# Patient Record
Sex: Male | Born: 1940 | Race: White | Hispanic: No | Smoking: Former smoker
Health system: Southern US, Community
[De-identification: ages and names within clinical notes are randomized; demographics above are authoritative.]

## PROBLEM LIST (undated history)

## (undated) DIAGNOSIS — I2510 Atherosclerotic heart disease of native coronary artery without angina pectoris: Secondary | ICD-10-CM

## (undated) DIAGNOSIS — G8929 Other chronic pain: Secondary | ICD-10-CM

## (undated) DIAGNOSIS — M25569 Pain in unspecified knee: Secondary | ICD-10-CM

## (undated) DIAGNOSIS — I219 Acute myocardial infarction, unspecified: Secondary | ICD-10-CM

## (undated) DIAGNOSIS — I10 Essential (primary) hypertension: Secondary | ICD-10-CM

## (undated) DIAGNOSIS — M1990 Unspecified osteoarthritis, unspecified site: Secondary | ICD-10-CM

## (undated) DIAGNOSIS — E785 Hyperlipidemia, unspecified: Secondary | ICD-10-CM

## (undated) HISTORY — DX: Atherosclerotic heart disease of native coronary artery without angina pectoris: I25.10

## (undated) HISTORY — DX: Acute myocardial infarction, unspecified: I21.9

## (undated) HISTORY — DX: Other chronic pain: G89.29

## (undated) HISTORY — DX: Pain in unspecified knee: M25.569

## (undated) HISTORY — DX: Essential (primary) hypertension: I10

## (undated) HISTORY — DX: Hyperlipidemia, unspecified: E78.5

## (undated) HISTORY — DX: Unspecified osteoarthritis, unspecified site: M19.90

---
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
## 2008-05-14 ENCOUNTER — Encounter: Payer: Self-pay | Admitting: Internal Medicine

## 2008-05-15 ENCOUNTER — Encounter: Payer: Self-pay | Admitting: Internal Medicine

## 2008-06-04 ENCOUNTER — Encounter: Payer: Self-pay | Admitting: Internal Medicine

## 2008-06-18 ENCOUNTER — Inpatient Hospital Stay: Payer: PRIVATE HEALTH INSURANCE | Admitting: Cardiology

## 2008-06-18 ENCOUNTER — Encounter: Payer: Self-pay | Admitting: Internal Medicine

## 2008-06-19 ENCOUNTER — Encounter: Payer: Self-pay | Admitting: Internal Medicine

## 2008-07-09 ENCOUNTER — Encounter: Payer: Self-pay | Admitting: Internal Medicine

## 2008-09-13 HISTORY — PX: CORONARY ARTERY BYPASS GRAFT: SHX141

## 2008-09-25 ENCOUNTER — Encounter: Payer: Self-pay | Admitting: Internal Medicine

## 2008-09-29 ENCOUNTER — Encounter: Payer: Self-pay | Admitting: Internal Medicine

## 2008-10-12 ENCOUNTER — Encounter: Payer: Self-pay | Admitting: Internal Medicine

## 2008-10-18 ENCOUNTER — Encounter: Payer: Self-pay | Admitting: Internal Medicine

## 2008-10-26 ENCOUNTER — Ambulatory Visit: Payer: Self-pay | Admitting: Internal Medicine

## 2008-10-26 DIAGNOSIS — E785 Hyperlipidemia, unspecified: Secondary | ICD-10-CM

## 2008-10-26 DIAGNOSIS — I25810 Atherosclerosis of coronary artery bypass graft(s) without angina pectoris: Secondary | ICD-10-CM | POA: Insufficient documentation

## 2008-10-26 DIAGNOSIS — M1710 Unilateral primary osteoarthritis, unspecified knee: Secondary | ICD-10-CM

## 2008-10-26 DIAGNOSIS — I10 Essential (primary) hypertension: Secondary | ICD-10-CM

## 2008-10-29 ENCOUNTER — Encounter: Payer: Self-pay | Admitting: Internal Medicine

## 2008-11-15 ENCOUNTER — Telehealth (INDEPENDENT_AMBULATORY_CARE_PROVIDER_SITE_OTHER): Payer: Self-pay | Admitting: *Deleted

## 2009-02-05 ENCOUNTER — Ambulatory Visit: Payer: Self-pay | Admitting: Internal Medicine

## 2009-03-20 ENCOUNTER — Telehealth: Payer: Self-pay | Admitting: Internal Medicine

## 2009-04-01 ENCOUNTER — Encounter: Payer: Self-pay | Admitting: Internal Medicine

## 2009-04-22 ENCOUNTER — Encounter (HOSPITAL_COMMUNITY): Admission: RE | Admit: 2009-04-22 | Discharge: 2009-05-22 | Payer: Self-pay | Admitting: Internal Medicine

## 2009-04-30 ENCOUNTER — Ambulatory Visit: Payer: Self-pay | Admitting: Cardiovascular Disease

## 2009-04-30 ENCOUNTER — Encounter: Payer: Self-pay | Admitting: Internal Medicine

## 2009-05-03 LAB — CONVERTED CEMR LAB
AST: 50 units/L — ABNORMAL HIGH (ref 0–37)
Alkaline Phosphatase: 84 units/L (ref 39–117)
BUN: 17 mg/dL (ref 6–23)
Calcium: 9.4 mg/dL (ref 8.4–10.5)
Chloride: 100 meq/L (ref 96–112)
Creatinine, Ser: 1.09 mg/dL (ref 0.40–1.50)
HDL: 56 mg/dL (ref >39)
Total Bilirubin: 0.4 mg/dL (ref 0.3–1.2)
Total CHOL/HDL Ratio: 3.6

## 2009-10-28 ENCOUNTER — Ambulatory Visit: Payer: Self-pay | Admitting: Internal Medicine

## 2009-12-11 ENCOUNTER — Telehealth (INDEPENDENT_AMBULATORY_CARE_PROVIDER_SITE_OTHER): Payer: Self-pay | Admitting: *Deleted

## 2009-12-11 ENCOUNTER — Telehealth: Payer: Self-pay | Admitting: Internal Medicine

## 2010-01-07 ENCOUNTER — Encounter: Payer: Self-pay | Admitting: Internal Medicine

## 2010-01-10 LAB — CONVERTED CEMR LAB
ALT: 79 units/L — ABNORMAL HIGH (ref 0–53)
AST: 101 units/L — ABNORMAL HIGH (ref 0–37)
Bilirubin, Direct: 0.2 mg/dL (ref 0.0–0.3)
Calcium: 9 mg/dL (ref 8.4–10.5)
Cholesterol: 169 mg/dL (ref 0–200)
Glucose, Bld: 102 mg/dL — ABNORMAL HIGH (ref 70–99)
Indirect Bilirubin: 0.4 mg/dL (ref 0.0–0.9)
Sodium: 138 meq/L (ref 135–145)
Total CHOL/HDL Ratio: 2.8

## 2010-03-06 ENCOUNTER — Ambulatory Visit: Payer: Self-pay | Admitting: Internal Medicine

## 2010-03-06 ENCOUNTER — Encounter: Payer: Self-pay | Admitting: Internal Medicine

## 2010-03-07 ENCOUNTER — Encounter: Payer: Self-pay | Admitting: Internal Medicine

## 2010-04-13 LAB — CONVERTED CEMR LAB
ALT: 56 units/L
BUN: 14 mg/dL (ref 6–23)
CO2: 25 meq/L (ref 19–32)
Chloride: 104 meq/L
Glomerular Filtration Rate, Af Am: >60 mL/min/{1.73_m2}
Glucose, Bld: 87 mg/dL (ref 70–99)
HDL: 51.3 mg/dL
Hgb A1c MFr Bld: 5.5 %
LDL Cholesterol: 125.3 mg/dL
Potassium: 4.6 meq/L (ref 3.5–5.3)
Potassium: 5 meq/L
Sodium: 136 meq/L (ref 135–145)
Total Bilirubin: 0.8 mg/dL
Triglyceride fasting, serum: 192 mg/dL

## 2010-04-17 NOTE — Assessment & Plan Note (Signed)
Summary: per check out/sf   Visit Type:  Follow-up Referring Shafiq Larch:  Italy Hughes MD Primary Camisha Srey:  Conchita Paris MD  CC:  no complaints.  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   Experienced NSTEMI in 4/10 underwent cath at [facility] by Dr. Darrold Junker. EF 67% LAD 90% mid, 60% distal, Diag 50%. LCX 30% OM2 100% RCA 60%. Underwent BMS to LAD. Treated with Plavix x 3 weeks. Admitted to [facility] with [place] in 7/10 and underwent LIMA-LAD and SVG-DIAG by Dr. 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 Returns for f/u.   We saw him 3 in November 2010. LDL was elevated so switched to Crestor 40.   Here for routine f/u. Continues to have problems with severe R knee OA and going to see Dr. Merita Norton. Remains fairly active as limited by his knee pain. No CP or SOB. BP doing pretty well with systolics 130-135. No readings over 140 recently. Dr. Candelaria Stagers following lipids. Now on lipitor 80/zetia 10/fish oil.  Last lipids (10/11) TC 169 HDL 61  LDL 93   Current Medications (verified): 1)  Aspirin 81 Mg Tbec (Aspirin) .... Take 2 Tablet By Mouth Daily 2)  Lisinopril 10 Mg Tabs (Lisinopril) .... Take One Tablet By Mouth Two Times A Day 3)  Metoprolol Succinate 50 Mg Xr24h-Tab (Metoprolol Succinate) .... Take One Tablet By Mouth Daily 4)  Lipitor 80 Mg Tabs (Atorvastatin Calcium) .... Take One Tablet By Mouth Daily. 5)  Fish Oil 1000 Mg Caps (Omega-3 Fatty Acids) .... 2 Caps By Mouth Daily 6)  Zetia 10 Mg Tabs (Ezetimibe) .... Take One Tablet By Mouth Daily.  Allergies (verified): 1)  ! * Wasp and [employer]  Review of Systems       As per HPI and past medical history; otherwise all systems negative.   Vital Signs:  Patient profile:   70 year old male Height:      71 inches Weight:      245 pounds BMI:     34.29 Pulse rate:   61 / minute BP sitting:   138 / 78  (left arm) Cuff size:   regular  Vitals Entered By:  Hardin Negus, RMA (March 06, 2010 11:55 AM)  Physical Exam  General:  Well appearing. no resp difficulty HEENT: normal Neck: supple. no JVD. Carotids 2+ bilat; no bruits. No lymphadenopathy or thryomegaly appreciated. Cor: PMI nondisplaced. Regular rate & rhythm. No rubs, gallops, murmur. Sternum looks good Lungs: clear Abdomen: obese soft, nontender, nondistended.Good bowel sounds. Extremities: no cyanosis, clubbing, rash, edema Neuro: alert & orientedx3, cranial nerves grossly intact. moves all 4 extremities w/o difficulty. 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 No evidence of ischemia. Continue current regimen.  Problem # 2:  HYPERTENSION, BENIGN (ICD-401.1) BP fairly well controlled. If systolics creep over 140 would increase lisinopril to 20 two times a day.  Problem # 3:  HYPERLIPIDEMIA-MIXED (ICD-272.4) Much improved. Getting LDL close to goal. Continue current regimen. F/u with Dr. Candelaria Stagers  Problem # 4:  DEGENERATIVE JOINT DISEASE, RIGHT KNEE [phone]) Given recent CABG surgery and relatively preserved functional capcity without evidence of angina; I fell he is low risk for any peri-operative cardiac complications and can proceed with knee replacement if needed without any further cardiac work-up.   Other Orders: EKG w/ Interpretation (93000)  Patient Instructions: 1)  Your physician wants you  to follow-up in: 6 months.  You will receive a reminder letter in the mail two months in advance. If you don't receive a letter, please call our office to schedule the follow-up appointment. Prescriptions: ZETIA 10 MG TABS (EZETIMIBE) Take one tablet by mouth daily.  #30 x 12   Entered by:   Meredith Staggers, RN   Authorized by:   Dolores Patty, MD, [facility]   Signed by:   Meredith Staggers, RN on 03/06/2010   Method used:   [facility]  [address] [id number]       [address]       [place], [place]  [address]       Ph: [phone]       Fax: [phone]   RxID:   [id number]

## 2010-04-17 NOTE — Assessment & Plan Note (Signed)
Summary: rov per pt call/lg   Visit Type:  Follow-up Referring Aizlynn Digilio:  Italy Hughes MD Primary Jehad Bisono:  Conchita Paris MD  CC:  no cardiac compliants.  History of Present Illness: [name] is a 70 y/o male with h/o HTN, HL and CAD.   Experienced NSTEMI in 4/10 underwent cath at [facility] by Dr. Darrold Junker. EF 67% LAD 90% mid, 60% distal, Diag 50%. LCX 30% OM2 100% RCA 60%. Underwent BMS to LAD. Treated with Plavix x 3 weeks. Admitted to [facility] with [place] in 7/10 and underwent LIMA-LAD and SVG-DIAG by Dr. 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 Returns for f/u.   We saw him 3 in November 2010. LDL was elevated so switched to Crestor 40. Most recent lipids by Dr. Candelaria Stagers in June 2011 TC 215 (down from 249) TG 107 HDL 58 LDL 136 (down from 045). BP 148/91.   Taking BP at home. In am often 170/110 but after he takes meds. BP down to 115-120/70-80 around 10:30 or 11a. Feels good. No CP or SOB. Not exercising regularly although he tries to use exercise bike occasionally.   Current Medications (verified): 1)  Aspirin Ec 325 Mg Tbec (Aspirin) .... Take One Tablet By Mouth Daily 2)  Lisinopril 10 Mg Tabs (Lisinopril) .... Take One Tablet By Mouth Daily 3)  Centrum  Tabs (Multiple Vitamins-Minerals) .Marland Kitchen.. 1 By Mouth Once Daily 4)  Vitamin E 400 Unit Caps (Vitamin E) .Marland Kitchen.. 1 By Mouth Once Daily 5)  Metoprolol Succinate 50 Mg Xr24h-Tab (Metoprolol Succinate) .... Take One Tablet By Mouth Daily 6)  Crestor 40 Mg Tabs (Rosuvastatin Calcium) .... Take One Tablet By Mouth Daily. 7)  Fish Oil 1000 Mg Caps (Omega-3 Fatty Acids) .... 2 Caps By Mouth Daily 8)  Allergy Pill ([facility]) .... Once Daily  Allergies (verified): 1)  ! * Wasp and [employer]  Review of Systems       As per HPI and past medical history; otherwise all systems negative.   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 No evidence of ischemia. Continue current regimen.  Problem # 2:  HYPERTENSION, BENIGN (ICD-401.1) BP normal at lunch but high in am. Will increase lisinopril to 20 two times a day (take a dose at night) and follow BP closely.   Problem # 3:  HYPERLIPIDEMIA-MIXED (ICD-272.4) LDL goal < 100 (ideal < 70). Still not at goal despite high-dose statin. Add zetia 10mg .  Recheck in 3 months.  Other Orders: EKG w/ Interpretation (93000)  Patient Instructions: 1)  Increase Lisinopril to 10mg  two times a day  2)  Start Zetia 10mg  daily 3)  Have labwork in 2 months (we have given you a prescription to have this done) 4)  Follow up in 3 months. Prescriptions: ZETIA 10 MG TABS (EZETIMIBE) Take one tablet by mouth daily.  #30 x 0   Entered by:   Meredith Staggers, RN   Authorized by:   Dolores Patty, MD, [facility]   Signed by:   Meredith Staggers, RN on 10/28/2009   Method  used:   Print then Give to Patient   RxID:   [id number] LISINOPRIL 10 MG TABS (LISINOPRIL) Take one tablet by mouth two times a day  #180 x 3   Entered by:   Meredith Staggers, RN   Authorized by:   Dolores Patty, MD, [facility]   Signed by:   Meredith Staggers, RN on 10/28/2009   Method used:   [facility]        [facility]  [address] [id number]       [address]       [place], [place]  [address]       Ph: [phone]       Fax: [phone]   RxID:   [id number]

## 2010-04-17 NOTE — Progress Notes (Signed)
Summary: REHAB  Phone Note Call from Patient Call back at Home Phone [phone]   Caller: SELF Call For: [name] Summary of Call: PT WOULD LIKE TO START CARDIAC REHAB AND WOULD LIKE FOR IT TO BE AT [facility] Initial call taken by: Harlon Flor,  March 20, 2009 3:45 PM  Follow-up for Phone Call        order pending MD signature. Charlena Cross RN BSN

## 2010-04-17 NOTE — Letter (Signed)
Summary: [facility] Office Visit Note   [facility] Visit Note   Imported By: Roderic Ovens 04/10/2010 11:50:56  _____________________________________________________________________  External Attachment:    Type:   Image     Comment:   External Document

## 2010-04-17 NOTE — Miscellaneous (Signed)
Summary: Order  Order   Imported By: [place] Carbo 04/04/2009 11:14:24  _____________________________________________________________________  External Attachment:    Type:   Image     Comment:   External Document

## 2010-04-17 NOTE — Progress Notes (Signed)
Summary: pt needs medication changes  Phone Note Refill Request Call back at Home Phone [phone] Message from:  [facility] [address]  Refills Requested: Medication #1:  Simvastatin pt can no longer take crestor  Initial call taken by: Omer Jack,  December 11, 2009 12:42 PM    New/Updated Medications: LIPITOR 80 MG TABS (ATORVASTATIN CALCIUM) Take one tablet by mouth daily. Prescriptions: LIPITOR 80 MG TABS (ATORVASTATIN CALCIUM) Take one tablet by mouth daily.  #30 x 6   Entered by:   Hardin Negus, RMA   Authorized by:   Dolores Patty, MD, [facility]   Signed by:   Hardin Negus, RMA on 12/11/2009   Method used:   [facility]        [facility]  [address] [id number]       [address]       [place], [place]  [address]       Ph: [phone]       Fax: [phone]   RxID:   [id number]

## 2010-04-17 NOTE — Progress Notes (Signed)
  Walk in Patient Form Recieved " Pt would like to be taken off of Crestor and placed on something New" sent to [facility]  December 11, 2009 8:42 AM

## 2010-05-07 NOTE — Letter (Signed)
Summary: [facility]   [facility]   Imported By: Marylou Mccoy 04/28/2010 13:36:26  _____________________________________________________________________  External Attachment:    Type:   Image     Comment:   External Document

## 2010-05-16 ENCOUNTER — Other Ambulatory Visit (HOSPITAL_COMMUNITY): Payer: Medicare Other

## 2010-05-20 ENCOUNTER — Other Ambulatory Visit (HOSPITAL_COMMUNITY): Payer: Medicare Other

## 2010-05-26 ENCOUNTER — Ambulatory Visit (HOSPITAL_COMMUNITY): Admission: RE | Admit: 2010-05-26 | Payer: Medicare Other | Source: Ambulatory Visit | Admitting: Orthopedic Surgery

## 2010-08-12 ENCOUNTER — Other Ambulatory Visit: Payer: Self-pay | Admitting: Orthopedic Surgery

## 2010-08-12 ENCOUNTER — Encounter (HOSPITAL_COMMUNITY): Payer: Medicare Other

## 2010-08-12 ENCOUNTER — Ambulatory Visit (HOSPITAL_COMMUNITY)
Admission: RE | Admit: 2010-08-12 | Discharge: 2010-08-12 | Disposition: A | Discharge status: Home / Self Care | Payer: Medicare Other | Source: Ambulatory Visit | Attending: Orthopedic Surgery | Admitting: Orthopedic Surgery

## 2010-08-12 ENCOUNTER — Other Ambulatory Visit (HOSPITAL_COMMUNITY): Payer: Self-pay | Admitting: Orthopedic Surgery

## 2010-08-12 DIAGNOSIS — Z01811 Encounter for preprocedural respiratory examination: Secondary | ICD-10-CM | POA: Insufficient documentation

## 2010-08-12 DIAGNOSIS — Z87891 Personal history of nicotine dependence: Secondary | ICD-10-CM | POA: Insufficient documentation

## 2010-08-12 DIAGNOSIS — I10 Essential (primary) hypertension: Secondary | ICD-10-CM | POA: Insufficient documentation

## 2010-08-12 DIAGNOSIS — Z01818 Encounter for other preprocedural examination: Secondary | ICD-10-CM | POA: Insufficient documentation

## 2010-08-12 DIAGNOSIS — Z01812 Encounter for preprocedural laboratory examination: Secondary | ICD-10-CM | POA: Insufficient documentation

## 2010-08-12 DIAGNOSIS — Z951 Presence of aortocoronary bypass graft: Secondary | ICD-10-CM | POA: Insufficient documentation

## 2010-08-12 LAB — URINALYSIS, ROUTINE W REFLEX MICROSCOPIC
Hgb urine dipstick: NEGATIVE
Nitrite: NEGATIVE
Specific Gravity, Urine: 1.016 (ref 1.005–1.030)
Urobilinogen, UA: 0.2 mg/dL (ref 0.0–1.0)
pH: 6 (ref 5.0–8.0)

## 2010-08-12 LAB — COMPREHENSIVE METABOLIC PANEL
ALT: 56 U/L — ABNORMAL HIGH (ref 0–53)
AST: 58 U/L — ABNORMAL HIGH (ref 0–37)
Albumin: 3.4 g/dL — ABNORMAL LOW (ref 3.5–5.2)
Alkaline Phosphatase: 122 U/L — ABNORMAL HIGH (ref 39–117)
Chloride: 98 mEq/L (ref 96–112)
GFR calc Af Amer: >60 mL/min (ref >60)
Potassium: 4.3 mEq/L (ref 3.5–5.1)
Sodium: 133 mEq/L — ABNORMAL LOW (ref 135–145)
Total Bilirubin: 0.5 mg/dL (ref 0.3–1.2)

## 2010-08-12 LAB — CBC
Platelets: 236 10*3/uL (ref 150–400)
RBC: 4.45 MIL/uL (ref 4.22–5.81)
WBC: 7.1 10*3/uL (ref 4.0–10.5)

## 2010-08-12 LAB — APTT: aPTT: 35 seconds (ref 24–37)

## 2010-08-16 NOTE — H&P (Addendum)
[name], [name]                ACCOUNT NO.:  [id number]  MEDICAL RECORD NO.:  [id number]  LOCATION:                                 FACILITY:  PHYSICIAN:  Ollen Gross, M.D.    DATE OF BIRTH:  19-Jan-1941  DATE OF ADMISSION:  08/18/2010 DATE OF DISCHARGE:                             HISTORY & PHYSICAL   CHIEF COMPLAINT:  Right knee pain.  BRIEF HISTORY:  [name] is a 70 year old male who came in to see Dr. Lequita Halt as a second opinion back in December.  He has had problems with the right knee for several years over the past 5-6 years as this has become more and more problematic.  He is to a point now where he has pain at all times, it pops with rest as well as with activity.  It is limiting what he is able to do.  He has had injections in the past that did not provide long-term benefit.  He now presents for a right total knee arthroplasty.  PRIMARY CARE PHYSICIAN:  Claude Manges, MD, at [facility].  CARDIOLOGIST:  Bevelyn Buckles. Bensimhon, MD  MEDICATION ALLERGIES:  None.  CURRENT MEDICATIONS: 1. Zetia. 2. Metoprolol. 3. Lisinopril. 4. Benicar. 5. Fish oil. 6. Centrum. 7. Aspirin.  He will discontinue the fish oil, Centrum, and aspirin prior to surgery.  PAST MEDICAL HISTORY: 1. End-stage arthritis of both knees, worse on the right than the     left. 2. Tinnitus. 3. Dentures, partial on the bottom and full on top. 4. Hypertension. 5. History of myocardial infarction in July 2010. 6. Hyperlipidemia. 7. Heart stenting followed by bypass surgery. 8. History of gout.  PAST SURGICAL HISTORY:  He has had cardiac bypass in July 2010 and an appendectomy in 1959.  The patient states that he has done fine with anesthesia in the past.  FAMILY HISTORY:  Father passed of lung cancer at the age of 78.  He had a brother that passed of myocardial infarction in 2006.  He was 1 years old.  Mother is living, she is 21.  SOCIAL HISTORY:  The  patient is divorced.  He works as a Education officer, environmental.  He admits past use of tobacco products.  The last time he smoked cigarettes was 30 years ago.  He drinks about 4 glasses of alcohol daily.  He has 4 children.  He lives alone.  He does not have a caregiver lined up after surgery.  I suggest that we have a social work consult for consideration of skilled nursing facility.  REVIEW OF SYSTEMS:  GENERAL:  Negative for fever, chills, or weight change.  HEENT:  Positive for ringing in the ears and dentures. DERMATOLOGIC:  The patient does have a rash on his posterior aspect of the right ankle.  He is going to have this evaluated by his primary care physician on August 15, 2010.  RESPIRATORY:  Positive for shortness of breath on exertion.  Last chest x-ray was Aug 12, 2010.  CARDIOVASCULAR: Negative for chest pain or palpitations.  GI:  Negative for nausea, vomiting, diarrhea.  GU: Negative for hematuria, dysuria. MUSCULOSKELETAL: Positive for joint pain and  joint swelling.  PHYSICAL EXAMINATION:  VITAL SIGNS:  Pulse 70, respirations 18, blood pressure 118/60 in the left arm. GENERAL:  [name] is alert and oriented x3.  He is a pleasant 70 year old male well developed, well nourished, no apparent distress who is stated height of 5 feet 10 inches and a stated weight of 238 pounds. HEENT:  Normocephalic, atraumatic.  Extraocular movements intact.  He wears glasses and dentures. NECK:  Supple.  Full range of motion without lymphadenopathy. CHEST:  Lungs are clear to auscultation bilaterally without wheezes, rhonchi, or rales. HEART:  Regular rate and rhythm without murmur, S1 and S2 sounds are appreciated. ABDOMEN:  Bowel sounds present in all 4 quadrants.  Abdomen is obese, soft, nontender to palpation. EXTREMITIES:  Right knee exhibits a varus deformity.  Range of motion is 5-120 degrees.  He has pain with palpation over the medial joint line. SKIN:  As stated in the review of systems, the patient  has a red raised area on the right ankle that is excoriated. NEUROLOGIC:  Intact.  Peripheral vascular carotid pulses 2+ bilaterally without bruit.  Radiograph of both knees appeared to be bone-on-bone tricompartmental, worse on the right than the left.  IMPRESSION:  Bilateral end-stage arthritis of both knees, worse on the right than the left.  PLAN:  Right total knee arthroplasty to be performed by Dr. Lequita Halt.     Rozell Searing, PAC   ______________________________ Ollen Gross, M.D.    LD/MEDQ  D:  08/14/2010  T:  08/14/2010  Job:  [id number]  cc:   Claude Manges, MD Fax: [phone]  Electronically Signed by Rozell Searing  on 09/08/2010 09:02:57 AM Electronically Signed by Ollen Gross M.D. on 09/08/2010 05:01:20 PM

## 2010-08-18 ENCOUNTER — Inpatient Hospital Stay (HOSPITAL_COMMUNITY)
Admission: RE | Admit: 2010-08-18 | Discharge: 2010-08-21 | DRG: 470 | Disposition: A | Discharge status: Skilled Nursing Facility | Payer: Medicare Other | Source: Ambulatory Visit | Attending: Orthopedic Surgery | Admitting: Orthopedic Surgery

## 2010-08-18 DIAGNOSIS — M1710 Unilateral primary osteoarthritis, unspecified knee: Principal | ICD-10-CM | POA: Diagnosis present

## 2010-08-18 DIAGNOSIS — E871 Hypo-osmolality and hyponatremia: Secondary | ICD-10-CM | POA: Diagnosis not present

## 2010-08-18 DIAGNOSIS — I2510 Atherosclerotic heart disease of native coronary artery without angina pectoris: Secondary | ICD-10-CM | POA: Diagnosis present

## 2010-08-18 DIAGNOSIS — Z79899 Other long term (current) drug therapy: Secondary | ICD-10-CM

## 2010-08-18 DIAGNOSIS — E876 Hypokalemia: Secondary | ICD-10-CM | POA: Diagnosis not present

## 2010-08-18 DIAGNOSIS — I10 Essential (primary) hypertension: Secondary | ICD-10-CM | POA: Diagnosis present

## 2010-08-18 DIAGNOSIS — M109 Gout, unspecified: Secondary | ICD-10-CM | POA: Diagnosis present

## 2010-08-18 DIAGNOSIS — I252 Old myocardial infarction: Secondary | ICD-10-CM

## 2010-08-18 DIAGNOSIS — Z7982 Long term (current) use of aspirin: Secondary | ICD-10-CM

## 2010-08-18 DIAGNOSIS — Z951 Presence of aortocoronary bypass graft: Secondary | ICD-10-CM

## 2010-08-18 DIAGNOSIS — H9319 Tinnitus, unspecified ear: Secondary | ICD-10-CM | POA: Diagnosis present

## 2010-08-18 DIAGNOSIS — M21169 Varus deformity, not elsewhere classified, unspecified knee: Secondary | ICD-10-CM | POA: Diagnosis present

## 2010-08-18 DIAGNOSIS — E785 Hyperlipidemia, unspecified: Secondary | ICD-10-CM | POA: Diagnosis present

## 2010-08-18 HISTORY — PX: JOINT REPLACEMENT: SHX530

## 2010-08-18 LAB — TYPE AND SCREEN: Antibody Screen: NEGATIVE

## 2010-08-19 LAB — BASIC METABOLIC PANEL
BUN: 11 mg/dL (ref 6–23)
CO2: 22 mEq/L (ref 19–32)
Chloride: 98 mEq/L (ref 96–112)
Creatinine, Ser: 0.87 mg/dL (ref 0.4–1.5)
GFR calc Af Amer: >60 mL/min (ref >60)
Potassium: 3.8 mEq/L (ref 3.5–5.1)

## 2010-08-19 LAB — CBC
Hemoglobin: 11.5 g/dL — ABNORMAL LOW (ref 13.0–17.0)
MCH: 31.6 pg (ref 26.0–34.0)
MCV: 92 fL (ref 78.0–100.0)
Platelets: 210 10*3/uL (ref 150–400)
RBC: 3.64 MIL/uL — ABNORMAL LOW (ref 4.22–5.81)
WBC: 10.6 10*3/uL — ABNORMAL HIGH (ref 4.0–10.5)

## 2010-08-20 LAB — BASIC METABOLIC PANEL
BUN: 9 mg/dL (ref 6–23)
Chloride: 96 mEq/L (ref 96–112)
Creatinine, Ser: 0.82 mg/dL (ref 0.4–1.5)
Glucose, Bld: 127 mg/dL — ABNORMAL HIGH (ref 70–99)
Potassium: 3.4 mEq/L — ABNORMAL LOW (ref 3.5–5.1)

## 2010-08-20 LAB — CBC
HCT: 31.6 % — ABNORMAL LOW (ref 39.0–52.0)
MCH: 31.8 pg (ref 26.0–34.0)
MCV: 92.1 fL (ref 78.0–100.0)
RBC: 3.43 MIL/uL — ABNORMAL LOW (ref 4.22–5.81)
WBC: 11.7 10*3/uL — ABNORMAL HIGH (ref 4.0–10.5)

## 2010-08-20 NOTE — Op Note (Signed)
[name], [name]                ACCOUNT NO.:  [id number]  MEDICAL RECORD NO.:  [id number]  LOCATION:  [id number]                         FACILITY:  [facility]  PHYSICIAN:  Ollen Gross, M.D.    DATE OF BIRTH:  [birth year]  DATE OF PROCEDURE:  08/18/2010 DATE OF DISCHARGE:                              OPERATIVE REPORT   PREOPERATIVE DIAGNOSIS:  Osteoarthritis, right knee.  POSTOPERATIVE DIAGNOSIS:  Osteoarthritis, right knee.  PROCEDURE:  Right total knee arthroplasty.  SURGEON:  Ollen Gross, M.D.  ASSISTANT:  Alexzandrew L. Perkins, P.A.C.  ANESTHESIA:  Spinal.  ESTIMATED BLOOD LOSS:  Minimal.  DRAINS:  Hemovac x1.  TOURNIQUET TIME:  37 minutes at 300 mmHg.  COMPLICATIONS:  None.  CONDITION.:  Stable to recovery.  BRIEF CLINICAL NOTE:  [name] is a 70 year old male with advanced arthritis of the right knee with progressively worsening pain and dysfunction.  He has failed nonop management and presents for total knee arthroplasty.  PROCEDURE IN DETAIL:  After successful administration of spinal anesthetic, a tourniquet was placed high on his right thigh and his right lower extremity was prepped and draped in the usual sterile fashion.  Extremity was wrapped in Esmarch, knee flexed, tourniquet inflated to 300 mmHg.  Midline incision was made with a #10 blade through subcutaneous tissue to the level of the extensor mechanism.  A fresh blade was used make a medial parapatellar arthrotomy.  Soft tissue on the proximal medial tibia subperiosteally elevated to the joint line with the knife and into the semimembranosus bursa with a Cobb elevator. Soft tissue laterally was elevated with attention being paid to avoid patellar tendon on tibial tubercle.  The patella was everted, knee flexed 90 degrees and ACL and PCL removed.  Drill was used to create a starting hole in the distal femur and canal was thoroughly irrigated. The 5-degree right valgus alignment guide was placed.   Distal femoral cutting block is pinned to remove 10 mm off the distal femur.  Distal femoral resection was made with an oscillating saw.  The tibia subluxed forward and the menisci removed.  The extramedullary tibial alignment guide was placed referencing proximally at the medial aspect of the tibial tubercle and distally along the second metatarsal axis of the tibial crest.  The block was pinned to remove 2 mm off the more deficient medial side.  Tibial resection was made with an oscillating saw.  Size 4 was the most appropriate tibial component and the proximal tibia was prepared with a modular drill and keel punch for the size 4.  Femoral sizing guide was placed, size 5 was most appropriate.  Rotation was marked off the epicondylar axis and confirmed by creating a rectangular flexion gap at 90 degrees.  Size 5 cutting block was placed in this rotation and the anterior-posterior chamfer cuts made. Intercondylar block was placed and that cut was made.  Trial size 5 posterior stabilized femur was placed.  A 10-mm posterior stabilized rotating platform insert trial was placed.  Full extension was achieved with excellent varus-valgus and anterior-posterior balance throughout full range of motion.  The patella was everted and thickness measured to 21 mm.  Freehand resection was taken to  12 mm, 41 template was placed, lug holes were drilled, trial patella was placed and it tracked normally.  Osteophytes were removed off the posterior femur with the trial in place.  All trials were removed and the cut bone surfaces prepared with pulsatile lavage.  Cement was mixed and once ready for implantation, the size 4 mobile bearing tibial tray, size 5 posterior stabilized femur and 41 patella were cemented into place.  The patella was held with a clamp.  Trial 10-mm insert was placed, knee held in full extension and all extruded cement removed.  When the cement had fully hardened, then the permanent  10-mm posterior stabilized rotating platform insert was placed in the tibial tray.  The wound was copiously irrigated with saline solution and the arthrotomy closed over Hemovac drain with interrupted #1 PDS.  Flexion against gravity was 135 degrees and patella tracked normally.  Tourniquet was then released after total time of 37 minutes.  Subcu was then closed with interrupted 2-0 Vicryl, subcuticular running 4-0 Monocryl.  Catheter for Marcaine pain pump was placed and pump initiated.  Incisions were cleaned and dried and Steri- Strips and bulky sterile dressing applied.  He was then placed into a knee immobilizer, awakened and transported to recovery in stable condition.     Ollen Gross, M.D.     FA/MEDQ  D:  08/18/2010  T:  08/18/2010  Job:  [id number]  Electronically Signed by Ollen Gross M.D. on 08/20/2010 06:01:55 PM

## 2010-08-21 LAB — CBC
HCT: 30.8 % — ABNORMAL LOW (ref 39.0–52.0)
MCH: 31.3 pg (ref 26.0–34.0)
MCHC: 34.1 g/dL (ref 30.0–36.0)
MCV: 91.7 fL (ref 78.0–100.0)
Platelets: 215 10*3/uL (ref 150–400)
RDW: 14.3 % (ref 11.5–15.5)

## 2010-08-21 LAB — BASIC METABOLIC PANEL
BUN: 13 mg/dL (ref 6–23)
Calcium: 8.9 mg/dL (ref 8.4–10.5)
Creatinine, Ser: 0.81 mg/dL (ref 0.4–1.5)
GFR calc non Af Amer: >60 mL/min (ref >60)
Glucose, Bld: 134 mg/dL — ABNORMAL HIGH (ref 70–99)

## 2010-08-28 ENCOUNTER — Encounter: Payer: Self-pay | Admitting: Internal Medicine

## 2010-09-08 NOTE — Discharge Summary (Signed)
[name], [name]                ACCOUNT NO.:  [id number]  MEDICAL RECORD NO.:  [id number]  LOCATION:  1618                         FACILITY:  [facility]  PHYSICIAN:  Ollen Gross, M.D.    DATE OF BIRTH:  February 21, 1941  DATE OF ADMISSION:  08/18/2010 DATE OF DISCHARGE:                        DISCHARGE SUMMARY - REFERRING   TENTATIVE DATE OF DISCHARGE:  August 21, 2010.  ADMITTING DIAGNOSES: 1. Osteoarthritis, right knee greater than left knee. 2. Tinnitus. 3. Hypertension. 4. History of myocardial infarction, July 2010. 5. Hyperlipidemia. 6. Status post coronary artery bypass grafting surgery. 7. Status post cardiac catheterization with stenting. 8. History of gout.  DISCHARGE DIAGNOSES: 1. Osteoarthritis, right knee, status post right total knee     replacement arthroplasty. 2. Postoperative hyponatremia, improving. 3. Postoperative hypokalemia, improved. 4. Tinnitus. 5. Hypertension. 6. History of myocardial infarction, July 2010. 7. Hyperlipidemia. 8. Status post coronary artery bypass grafting surgery. 9. Status post cardiac catheterization with stenting. 10.History of gout.  PROCEDURE:  August 18, 2010, right total knee.  Surgeon, Dr. Lequita Halt. Assistant, Alexzandrew L. Perkins, P.A.C.  Spinal anesthesia. Tourniquet time, 37 minutes.  CONSULTS:  None.  BRIEF HISTORY:  The patient is 69-year male with advanced arthritis of the right knee with progressive worsening pain and dysfunction, failed nonoperative management, now presents for total knee arthroplasty.  LABORATORY DATA:  Preop CBC showed a hemoglobin of 14.0, hematocrit of 41.3, white cell count 7.1, platelets 236.  PT/INR of 13.5 and 1.01 with PTT of 35.  Chem panel on admission, slightly low sodium of 133.  He did have elevated alk phos of 122, elevated SGOT of 58, elevated SGPT of 56, low albumin at 3.4.  Remaining Chem panel within normal limits.  Preop UA negative.  Blood group type A positive.  Nasal swabs  were negative for staph aureus, negative for MRSA.  Serial CBCs were followed. Hemoglobin dropped down to 11.5 and 10.9.  Last noted H and H 10.5 and hematocrit of 30.8.  Serial BMETs were followed for 3 days.  Sodium did drop from 133 down to 126, back up to 129 and improving.  Potassium dropped from 4.3 to 3.4, back up to 3.6.  Remaining electrolytes remained within normal limits.  Glucose went up from 91 to 157, back down to 134.  He had a previous chest x-ray on Aug 12, 2010, infiltrates within the lung, do not have previous study to know whether they are acute on chronic, may reflect pulmonary infiltrate densities associated with pneumonia, cannot exclude densities associated with chronic pulmonary fibrotic change or associated pleural thickening.  If the patient has acute illness, would favor pneumonia.  Please note this was his preoperative chest x-ray.  EKG dated March 06, 2010, sinus rhythm, septal infarct, age undetermined, abnormal EKG confirmed by Dr. Arvilla Meres.  HOSPITAL COURSE:  The patient admitted to [facility], taken to OR, underwent above-stated procedure without complications.  The patient tolerated the procedure well, later transferred to the recovery room, orthopedic floor, started on p.o. and IV analgesic pain control following surgery, given 24 hours postop IV antibiotics.  Started on Xarelto for DVT prophylaxis.  Initially started on PCA morphine.  Had fair amount  of pain through the night.  Doing a little bit better on morning of day #1.  The patient initially wanted to go home but we will see how the patient was going to do with his therapy.  He was started back on all his cardiac meds especially his beta blocker due to having history of MI.  His blood pressure was a little elevated morning of day #1 but he had had his blood pressure meds, so we went ahead and initiated those.  We added sublingual nitro just as a precautionary measure.   He had some low sodium on day #1, felt to be a delusional component.  Started diuresing fluids pretty well.  Hemoglobin looked good at 11.  He started getting up out of bed and was difficult getting up.  When he got up, he pulled out his IV and his on On-Q pain pump which would have removed the next day, anyway.  He was slow to progress on day #1 and also on day #2.  Dressing changed on day #2.  Incision looked good.  He had had a little bit of disorientation on the evening of day #1 and into the morning of day #2.  We changed his meds.  His sodium was down a little bit, down to 126.  We put him on some fluid restrictions.  He actually concentrated his sodium back up after this. He had a little bit low potassium, so we put him on some oral potassium supplements.  His blood pressure was doing a little bit better. Otherwise, he had no complaints.  Slowly progressing with Physical Therapy and felt that he may require skilled facility at this point, so got social work involved.  He was seen back on rounds on Thursday, postop day #3, he was doing much better but still only slowly progressing.  He had no disorientation.  Sodium was improving.  His potassium was back up.  His hemoglobin was stable at 10.5.  His incision was healing well.  It was felt that he would require some type of skilled facility.  The patient was in agreement.  We got social work involved on postop day #2.  We are waiting on the bed available.  No beds were available or no offers were made at the time of dictation but if a bed did become available, then we will transfer him out at that time.  DISCHARGE PLAN: 1. Possible tentative transfer today on August 21, 2010. 2. Discharge diagnoses please see above. 3. Discharge meds.  Current medications at the time of dictation: 1. Clotrimazole topical cream 1% to right ankle b.i.d. 2. Zetia 10 mg daily. 3. Benicar 20 mg daily. 4. Metoprolol XL 50 mg. 5. Xarelto 10 mg daily for  3 weeks, then discontinue the Xarelto. 6. Colace 100 mg p.o. b.i.d. 7. Robaxin 500 mg p.o. q.6-8 h p.r.n. spasm. 8. Restoril 15 to 30 mg p.o. q.h.s. p.r.n. sleep. 9. Tylenol 325 one or two every 4 to 6 hours as needed for mild pain,     temperature or headache. 10.OxyIR 5 mg one or two every 4 hours as needed for moderate pain. 11.Sublingual nitro 0.4 mg every 5 minutes p.r.n. chest pain. 12.Ativan 1 mg q.8 h p.r.n. agitation.  This was added during the     hospital course.  Please note this was not listed as his     medication.  This was a p.r.n.  DIET:  Heart-healthy cardiac diet.  ACTIVITIES:  He is weightbearing as  tolerated.  Total knee protocol.  PT and OT for gait training, ambulation, ADLs, range of motion and strengthening exercises.  Please note he may start showering at transfer, however, do not submerge incision under water.  FOLLOWUP:  He is to follow with Dr. Lequita Halt in the office on either Tuesday September 02, 2010, or Wednesday September 03, 2010.  Please contact the office for followup appointment at [phone].  DISPOSITION:  Pending at time of dictation.  Waiting on final bed offers.  CONDITION ON DISCHARGE:  Slowly improving.  Felt be a good rehab candidate.     Alexzandrew L. Julien Girt, P.A.C. ______________________________ Ollen Gross, M.D.    ALP/MEDQ  D:  08/21/2010  T:  08/21/2010  Job:  [id number]  cc:   Claude Manges, MD Fax: [phone]  Bevelyn Buckles. Bensimhon, MD [address], [place] [address]  Electronically Signed by Patrica Duel P.A.C. on 09/03/2010 07:12:33 AM Electronically Signed by Ollen Gross M.D. on 09/08/2010 05:01:15 PM

## 2010-09-12 ENCOUNTER — Encounter: Payer: Self-pay | Admitting: Family Medicine

## 2010-09-12 DIAGNOSIS — H9319 Tinnitus, unspecified ear: Secondary | ICD-10-CM | POA: Insufficient documentation

## 2010-09-12 DIAGNOSIS — M109 Gout, unspecified: Secondary | ICD-10-CM | POA: Insufficient documentation

## 2010-09-12 DIAGNOSIS — M1990 Unspecified osteoarthritis, unspecified site: Secondary | ICD-10-CM

## 2010-09-15 ENCOUNTER — Encounter: Payer: Self-pay | Admitting: Internal Medicine

## 2010-09-15 ENCOUNTER — Ambulatory Visit (INDEPENDENT_AMBULATORY_CARE_PROVIDER_SITE_OTHER): Payer: Medicare Other | Admitting: Internal Medicine

## 2010-09-15 VITALS — BP 110/70 | HR 67 | Temp 98.0°F | Ht 70.0 in | Wt 228.0 lb

## 2010-09-15 DIAGNOSIS — R05 Cough: Secondary | ICD-10-CM

## 2010-09-15 DIAGNOSIS — R918 Other nonspecific abnormal finding of lung field: Secondary | ICD-10-CM

## 2010-09-15 NOTE — Progress Notes (Signed)
Subjective:     Patient ID: [name], male   DOB: 1940-04-14, 70 y.o.   MRN: [id number]  HPI  72 yowm quit smoking in 1982 with some episodic sob resolved completely without the need for any respiratory meds   09/15/2010 Initial pulmonary office eval referred by Dr Tanya Nones for abn cxr.     Cc abrupt onset cough onset  x sev weeks then cxr  05/26/10 dx as pneumonia on L  at [place] [facility] rx prednisone and avelox  And then zithromax and 100% better symptomatically but cxr suggests new problem on R  Then CT at [facility] 08/15/10 c/w nodular changes on R  but deferrred rx until after knee replacement R June 4th and has done well since then, essentially no cough or sob.  Pt denies any significant sore throat, dysphagia, itching, sneezing,  nasal congestion or excess/ purulent secretions,  fever, chills, sweats, unintended wt loss, pleuritic or exertional cp, hempoptysis, orthopnea pnd or leg swelling.    Also denies any obvious fluctuation of symptoms with weather or environmental changes or other aggravating or alleviating factors.          Review of Systems  Constitutional: Negative for fever, chills, activity change, appetite change and unexpected weight change.  HENT: Negative for congestion, sore throat, rhinorrhea, sneezing, trouble swallowing, dental problem, voice change and postnasal drip.   Eyes: Negative for visual disturbance.  Respiratory: Negative for cough, choking and shortness of breath.   Cardiovascular: Negative for chest pain and leg swelling.  Gastrointestinal: Negative for nausea, vomiting and abdominal pain.  Genitourinary: Negative for difficulty urinating.  Musculoskeletal: Negative for arthralgias.  Skin: Negative for rash.  Psychiatric/Behavioral: Negative for behavioral problems and confusion.       Objective:   Physical Exam Pleasant amb wm mod obese nad HEENT: nl dentition, turbinates, and orophanx. Nl external ear canals without cough reflex   NECK :   without JVD/Nodes/TM/ nl carotid upstrokes bilaterally   LUNGS: no acc muscle use, clear to A and P bilaterally without cough on insp or exp maneuvers   CV:  RRR  no s3 or murmur or increase in P2, no edema   ABD:  soft and nontender with nl excursion in the supine position. No bruits or organomegaly, bowel sounds nl  MS:  warm without deformities, calf tenderness, cyanosis or clubbing  SKIN: warm and dry without lesions    NEURO:  alert, approp, no deficits    cxr 05/16/10 ? LLL opacities, R lung was clear   ct 08/15/10 extensive bilateral as opacities plus a more nodular area in RUL posteriorly 3x4 cm Assessment:         Plan:

## 2010-09-15 NOTE — Patient Instructions (Addendum)
Please schedule a follow up office visit in 6 weeks, call sooner if needed with old xrays in hand from your visits with pneumonia for CXR and PFT's on return  GERD (REFLUX)  is an extremely common cause of respiratory symptoms, many times with no significant heartburn at all.    It can be treated with medication, but also with lifestyle changes including avoidance of late meals, excessive alcohol, smoking cessation, and avoid fatty foods, chocolate, peppermint, colas, red wine, and acidic juices such as orange juice.  NO MINT OR MENTHOL PRODUCTS SO NO COUGH DROPS  USE SUGARLESS CANDY INSTEAD (jolley ranchers or Stover's)  NO OIL BASED VITAMINS (no fish oil or E)

## 2010-09-17 DIAGNOSIS — R918 Other nonspecific abnormal finding of lung field: Secondary | ICD-10-CM | POA: Insufficient documentation

## 2010-09-17 NOTE — Assessment & Plan Note (Addendum)
Pattern of migratory subacute airspace and nodular changes following a very benign course is typical of cryptogenic pneumonia syndrome (BOOP) and does not typically need a tissue dx as long as no need to treat with chronic steroids and the problem resolves, though the risk of relapse is high, especially in the first year.  Discussed in detail all the  indications, usual  risks and alternatives  relative to the benefits with patient who agrees to proceed with conservative f/u by plain cxr unless new symptoms erupt.   Lipoid pneumonia from asp of various oils in diet could also do this so needs to stop all oil based vitamins and follow a GERD diet it in meantime

## 2010-11-03 ENCOUNTER — Ambulatory Visit: Payer: Medicare Other | Admitting: Internal Medicine

## 2012-06-30 ENCOUNTER — Telehealth: Payer: Self-pay | Admitting: Family Medicine

## 2012-06-30 MED ORDER — OLMESARTAN MEDOXOMIL 40 MG PO TABS: 40.0000 mg | ORAL_TABLET | Freq: Every day | ORAL | Status: DC

## 2012-06-30 MED ORDER — METOPROLOL SUCCINATE ER 50 MG PO TB24: 50.0000 mg | ORAL_TABLET | Freq: Every day | ORAL | Status: DC

## 2012-06-30 NOTE — Telephone Encounter (Signed)
Pt needs to be seen.  Have tried to contact and have been unable.  Refills denied.

## 2012-06-30 NOTE — Telephone Encounter (Signed)
Rx Refilled  

## 2012-07-06 ENCOUNTER — Telehealth: Payer: Self-pay | Admitting: Family Medicine

## 2012-07-06 MED ORDER — EZETIMIBE 10 MG PO TABS: 10.0000 mg | ORAL_TABLET | Freq: Every day | ORAL | Status: DC

## 2012-07-06 NOTE — Telephone Encounter (Signed)
Per Dr. Tanya Nones ok to fill. Filled per protocol

## 2012-07-18 ENCOUNTER — Encounter: Payer: Self-pay | Admitting: Family Medicine

## 2012-07-18 ENCOUNTER — Ambulatory Visit (INDEPENDENT_AMBULATORY_CARE_PROVIDER_SITE_OTHER): Payer: Medicare Other | Admitting: Family Medicine

## 2012-07-18 VITALS — BP 132/70 | HR 64 | Temp 98.6°F | Resp 20 | Ht 70.0 in | Wt 265.0 lb

## 2012-07-18 DIAGNOSIS — Z0000 Encounter for general adult medical examination without abnormal findings: Secondary | ICD-10-CM

## 2012-07-18 DIAGNOSIS — E785 Hyperlipidemia, unspecified: Secondary | ICD-10-CM

## 2012-07-18 DIAGNOSIS — I10 Essential (primary) hypertension: Secondary | ICD-10-CM

## 2012-07-18 DIAGNOSIS — Z125 Encounter for screening for malignant neoplasm of prostate: Secondary | ICD-10-CM

## 2012-07-18 DIAGNOSIS — Z23 Encounter for immunization: Secondary | ICD-10-CM

## 2012-07-18 DIAGNOSIS — R918 Other nonspecific abnormal finding of lung field: Secondary | ICD-10-CM

## 2012-07-18 DIAGNOSIS — I25810 Atherosclerosis of coronary artery bypass graft(s) without angina pectoris: Secondary | ICD-10-CM

## 2012-07-18 DIAGNOSIS — R7989 Other specified abnormal findings of blood chemistry: Secondary | ICD-10-CM

## 2012-07-18 DIAGNOSIS — R160 Hepatomegaly, not elsewhere classified: Secondary | ICD-10-CM

## 2012-07-18 DIAGNOSIS — L519 Erythema multiforme, unspecified: Secondary | ICD-10-CM

## 2012-07-18 LAB — CBC WITH DIFFERENTIAL/PLATELET
Basophils Relative: 0 % (ref 0–1)
Hemoglobin: 14.9 g/dL (ref 13.0–17.0)
Lymphocytes Relative: 28 % (ref 12–46)
MCHC: 35.1 g/dL (ref 30.0–36.0)
Monocytes Relative: 5 % (ref 3–12)
Neutro Abs: 4.6 10*3/uL (ref 1.7–7.7)
Neutrophils Relative %: 66 % (ref 43–77)
RBC: 4.34 MIL/uL (ref 4.22–5.81)
WBC: 6.9 10*3/uL (ref 4.0–10.5)

## 2012-07-18 LAB — BASIC METABOLIC PANEL
CO2: 23 mEq/L (ref 19–32)
Calcium: 9.1 mg/dL (ref 8.4–10.5)
Creat: 0.97 mg/dL (ref 0.50–1.35)

## 2012-07-18 LAB — HEPATIC FUNCTION PANEL
AST: 110 U/L — ABNORMAL HIGH (ref 0–37)
Bilirubin, Direct: 0.3 mg/dL (ref 0.0–0.3)
Total Bilirubin: 1 mg/dL (ref 0.3–1.2)

## 2012-07-18 LAB — LIPID PANEL
Cholesterol: 233 mg/dL — ABNORMAL HIGH (ref 0–200)
Total CHOL/HDL Ratio: 5.4 Ratio
Triglycerides: 131 mg/dL (ref <150)
VLDL: 26 mg/dL (ref 0–40)

## 2012-07-18 NOTE — Progress Notes (Signed)
Subjective:    Patient ID: [name], male    DOB: 1941-01-11, 72 y.o.   MRN: [id number]  HPI Patient presents today for complete physical exam. He is not followed regularly. He has not been seen in over a year. He has a history of recurrent pulmonary infiltrates. He was originally seen in pulmonology, Dr. Melvyn Novas.  He is to follow up with him. There was some concern as to possible BOOP.  Is not currently on corticosteroids. I did obtain a CT scan last year that showed improving infiltrates and no evidence of neoplasm.  He has not been seen since. Of note he is complaining of daily arterial like rash on his arms trunk legs and chest. It is intensely pruritic. It has the physical appearance of chronic urticaria versus possibly erythema multiforme.  He's tried over-the-counter Eucerin cream with minimal success. He has not tried any antihistamines. Past Medical History  Diagnosis Date  . CAD (coronary artery disease)     -S/P BMS to 99% LAD lesion 06/19/08 [facility] -recurrent [place] 0> CABG w LIMA-LAD and SVG-Diag for complex bifurcational disease -EF 65%  . HTN (hypertension)   . Chronic knee pain   . Hyperlipidemia   . Myocardial infarction Juky 2010  . Arthritis     both knees end stage   Current Outpatient Prescriptions on File Prior to Visit  Medication Sig Dispense Refill  . aspirin 81 MG EC tablet Take 81 mg by mouth daily.        Marland Kitchen ezetimibe (ZETIA) 10 MG tablet Take 1 tablet (10 mg total) by mouth daily.  90 tablet  1  . metoprolol succinate (TOPROL-XL) 50 MG 24 hr tablet Take 1 tablet (50 mg total) by mouth daily.  90 tablet  1  . olmesartan (BENICAR) 40 MG tablet Take 1 tablet (40 mg total) by mouth daily.  90 tablet  1   No current facility-administered medications on file prior to visit.   No Known Allergies History   Social History  . Marital Status: Divorced    Spouse Name: N/A    Number of Children: 77  . Years of Education: N/A   Occupational History  .     Social History  Main Topics  . Smoking status: Former Smoker -- 1.50 packs/day for 21 years    Types: Cigarettes    Quit date: 03/16/1980  . Smokeless tobacco: Former Systems developer    Quit date: 03/16/1996  . Alcohol Use: 14.0 oz/week    28 drink(s) per week  . Drug Use: No  . Sexually Active: Not on file     Comment: retired Company secretary   Other Topics Concern  . Not on file   Social History Narrative   Full time. Single. Regularly exercises.    Family History  Problem Relation Age of Onset  . Heart attack Father   . Hypertension Father   . Lung cancer Father   . Heart attack Brother       Review of Systems  All other systems reviewed and are negative.       Objective:   Physical Exam  Constitutional: He is oriented to person, place, and time. He appears well-developed and well-nourished.  HENT:  Head: Normocephalic and atraumatic.  Right Ear: External ear normal.  Left Ear: External ear normal.  Nose: Nose normal.  Mouth/Throat: Oropharynx is clear and moist. No oropharyngeal exudate.  Eyes: Conjunctivae and EOM are normal. Pupils are equal, round, and reactive to light. Right eye exhibits  no discharge. Left eye exhibits no discharge. No scleral icterus.  Neck: Normal range of motion. Neck supple. No JVD present. No tracheal deviation present. No thyromegaly present.  Cardiovascular: Normal rate, regular rhythm, normal heart sounds and intact distal pulses.  Exam reveals no gallop and no friction rub.   No murmur heard. Pulmonary/Chest: Effort normal and breath sounds normal. No respiratory distress. He has no wheezes. He has no rales. He exhibits no tenderness.  Abdominal: Soft. Bowel sounds are normal. He exhibits no distension and no mass. There is no tenderness. There is no rebound and no guarding.  Genitourinary: Penis normal. No penile tenderness.  Musculoskeletal: Normal range of motion. He exhibits no edema and no tenderness.  Lymphadenopathy:    He has no cervical adenopathy.   Neurological: He is alert and oriented to person, place, and time. He has normal reflexes. He displays normal reflexes. No cranial nerve deficit. He exhibits normal muscle tone. Coordination normal.  Skin: Skin is warm and dry. Rash noted. No erythema. No pallor.  Psychiatric: He has a normal mood and affect. His behavior is normal. Judgment and thought content normal.   patient's prostate is firm but symmetric.  Patient has widespread urticaria, hives, and wheals.  Some however have the appearance of erythema multiforme.         Assessment & Plan:  1. Routine general medical examination at a health care facility Patient refuses colonoscopy. Update his immunizations. He is given a Pneumovax today. Discussed Fosamax. He will return if he wishes to get the Fosamax. - Basic Metabolic Panel - CBC with Differential - Hepatic Function Panel - Lipid Panel - PSA, Medicare - Pneumococcal polysaccharide vaccine 23-valent greater than or equal to 2yo subcutaneous/IM  2. HYPERLIPIDEMIA-MIXED LDL is less than 70. Check fasting lipid panel - Hepatic Function Panel - Lipid Panel  3. HYPERTENSION, BENIGN Blood pressure is currently controlled, continue current medications - Basic Metabolic Panel  4. CAD, AUTOLOGOUS BYPASS GRAFT He is not currently taking an aspirin. I recommended he resume aspirin 81 mg by mouth daily. - Hepatic Function Panel - Lipid Panel  5. 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 I will obtain a chest x-ray to evaluate for disease recurrence. 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 I performed a punch biopsyto evaluate. If biopsy returns as chronic urticaria, we can start the patient on daily antihistamine therapy. A lesion on his right elbow was anesthetized with 0.1% lidocaine with epinephrine. A 5 mm punch biopsy was then  obtained using sterile fashion. It was closed with one 3-0 Ethilon suture. Suture come out in one week. -Pathology Report  7. Prostate cancer screening Patient has a slightly abnormal exam check a PSA - PSA, Medicare

## 2012-07-19 NOTE — Addendum Note (Signed)
Addended by: Lynnea Ferrier on: 07/19/2012 07:30 AM   Modules accepted: Orders

## 2012-07-20 ENCOUNTER — Ambulatory Visit
Admission: RE | Admit: 2012-07-20 | Discharge: 2012-07-20 | Disposition: A | Discharge status: Home / Self Care | Payer: Medicare Other | Source: Ambulatory Visit | Attending: Family Medicine | Admitting: Family Medicine

## 2012-07-20 DIAGNOSIS — R160 Hepatomegaly, not elsewhere classified: Secondary | ICD-10-CM

## 2012-07-20 DIAGNOSIS — R918 Other nonspecific abnormal finding of lung field: Secondary | ICD-10-CM

## 2012-07-29 ENCOUNTER — Encounter: Payer: Self-pay | Admitting: Family Medicine

## 2012-08-24 ENCOUNTER — Encounter: Payer: Self-pay | Admitting: Family Medicine

## 2012-10-19 ENCOUNTER — Telehealth: Payer: Self-pay | Admitting: Family Medicine

## 2012-10-19 MED ORDER — EZETIMIBE 10 MG PO TABS: 10.0000 mg | ORAL_TABLET | Freq: Every day | ORAL | Status: AC

## 2012-10-19 NOTE — Telephone Encounter (Signed)
Rx Refilled  

## 2012-11-14 DEATH — deceased

## 2013-01-23 ENCOUNTER — Other Ambulatory Visit: Payer: Self-pay | Admitting: Family Medicine

## 2013-04-13 ENCOUNTER — Other Ambulatory Visit: Payer: Self-pay | Admitting: Family Medicine

## 2013-06-01 ENCOUNTER — Ambulatory Visit (INDEPENDENT_AMBULATORY_CARE_PROVIDER_SITE_OTHER): Payer: Medicare Other | Admitting: Family Medicine

## 2013-06-01 ENCOUNTER — Encounter: Payer: Self-pay | Admitting: Family Medicine

## 2013-06-01 VITALS — BP 170/92 | HR 62 | Temp 97.1°F | Resp 16 | Ht 71.0 in | Wt 248.0 lb

## 2013-06-01 DIAGNOSIS — Z1211 Encounter for screening for malignant neoplasm of colon: Secondary | ICD-10-CM

## 2013-06-01 DIAGNOSIS — Z23 Encounter for immunization: Secondary | ICD-10-CM

## 2013-06-01 DIAGNOSIS — I10 Essential (primary) hypertension: Secondary | ICD-10-CM

## 2013-06-01 MED ORDER — ATORVASTATIN CALCIUM 40 MG PO TABS: 40.0000 mg | ORAL_TABLET | Freq: Every day | ORAL | Status: DC

## 2013-06-01 MED ORDER — VALSARTAN 320 MG PO TABS: 320.0000 mg | ORAL_TABLET | Freq: Every day | ORAL | Status: DC

## 2013-06-01 MED ORDER — METOPROLOL SUCCINATE ER 50 MG PO TB24: ORAL_TABLET | ORAL | Status: DC

## 2013-06-01 NOTE — Progress Notes (Signed)
Subjective:    Patient ID: [name], male    DOB: 04/02/1940, 73 y.o.   MRN: [id number]  HPI Patient presents today for medication followup. His insurance has refused to pay on Benicar and Zetia.  Therefore he discontinued the medication 2 months ago. The patient stopped taking his Toprol-XL when he ran out several months ago. He is not on any medication at this time other than aspirin 81 mg by mouth daily. Unfortunately his blood pressure is elevated 170/92. He denies any chest pain, shortness of breath, dyspnea on exertion, or angina. He continues to abuse alcohol and drinks approximately 1 pint of alcohol per day. He continues to refuse prostate cancer screening and colonoscopies. He is due for both. The patient has a history of pneumonia and has received Pneumovax 23. He is due for Prevnar 13. Past Medical History  Diagnosis Date  . CAD (coronary artery disease)     -S/P BMS to 99% LAD lesion 06/19/08 [facility] [place] 0> CABG w LIMA-LAD and SVG-Diag for complex bifurcational disease -EF 65%  . HTN (hypertension)   . Chronic knee pain   . Hyperlipidemia   . Myocardial infarction Juky 2010  . Arthritis     both knees end stage   Past Surgical History  Procedure Laterality Date  . Joint replacement  08/18/10     Rt TKR  . Coronary artery bypass graft  July 2010  . Appendectomy  1959   Current Outpatient Prescriptions on File Prior to Visit  Medication Sig Dispense Refill  . aspirin 81 MG EC tablet Take 81 mg by mouth daily.        Marland Kitchen. BENICAR 40 MG tablet TAKE 1 TABLET BY MOUTH DAILY  90 tablet  0  . ezetimibe (ZETIA) 10 MG tablet Take 1 tablet (10 mg total) by mouth daily.  90 tablet  1   No current facility-administered medications on file prior to visit.   No Known Allergies History   Social History  . Marital Status: Divorced    Spouse Name: N/A    Number of Children: 4  . Years of Education: N/A   Occupational History  .     Social History Main Topics  .  Smoking status: Former Smoker -- 1.50 packs/day for 21 years    Types: Cigarettes    Quit date: 03/16/1980  . Smokeless tobacco: Former NeurosurgeonUser    Quit date: 03/16/1996  . Alcohol Use: 14.0 oz/week    28 drink(s) per week  . Drug Use: No  . Sexual Activity: Not on file     Comment: retired Optician, dispensingminister   Other Topics Concern  . Not on file   Social History Narrative   Full time. Single. Regularly exercises.       Review of Systems  All other systems reviewed and are negative.       Objective:   Physical Exam  Vitals reviewed. Constitutional: He is oriented to person, place, and time. He appears well-developed and well-nourished.  Cardiovascular: Normal rate, regular rhythm and intact distal pulses.   Murmur heard. Pulmonary/Chest: Effort normal and breath sounds normal. No respiratory distress. He has no wheezes. He has no rales.  Abdominal: Soft. Bowel sounds are normal. He exhibits no distension and no mass. There is no tenderness. There is no rebound and no guarding.  Musculoskeletal: Normal range of motion. He exhibits no edema.  Neurological: He is alert and oriented to person, place, and time. He has normal reflexes. He  displays normal reflexes. No cranial nerve deficit. He exhibits normal muscle tone. Coordination normal.  Skin: Skin is warm. No rash noted. No erythema. No pallor.          Assessment & Plan:  1. HTN (hypertension) Blood pressure is extremely elevated today. Begin XL 50 mg by mouth daily and Diovan 320 mg by mouth daily immediately. Recheck blood pressure here at an office visit in 3 weeks. Is history of coronary artery disease I will also check a fasting lipid panel. His goal LDL is less than 70. I have recommended the patient begin Lipitor 40 mg by mouth daily for secondary prevention of coronary artery disease. Also recommended that he abstain from alcohol. - COMPLETE METABOLIC PANEL WITH GFR - CBC with Differential - Lipid panel  2. Colon cancer  screening Patient refuses a colonoscopy but he consents to fecal occult blood cards. - Fecal occult blood, imunochemical  3. Need for prophylactic vaccination against Streptococcus pneumoniae (pneumococcus) Patient was given Prevnar 13 today in the office. - Pneumococcal conjugate vaccine 13-valent IM

## 2013-06-02 LAB — CBC WITH DIFFERENTIAL/PLATELET
BASOS ABS: 0 10*3/uL (ref 0.0–0.1)
BASOS PCT: 0 % (ref 0–1)
Eosinophils Absolute: 0.2 10*3/uL (ref 0.0–0.7)
Eosinophils Relative: 4 % (ref 0–5)
HEMATOCRIT: 44.9 % (ref 39.0–52.0)
Hemoglobin: 15.6 g/dL (ref 13.0–17.0)
Lymphocytes Relative: 36 % (ref 12–46)
Lymphs Abs: 1.8 10*3/uL (ref 0.7–4.0)
MCH: 33.5 pg (ref 26.0–34.0)
MCHC: 34.7 g/dL (ref 30.0–36.0)
MCV: 96.4 fL (ref 78.0–100.0)
Monocytes Absolute: 0.4 10*3/uL (ref 0.1–1.0)
Monocytes Relative: 8 % (ref 3–12)
NEUTROS ABS: 2.7 10*3/uL (ref 1.7–7.7)
Neutrophils Relative %: 52 % (ref 43–77)
Platelets: 213 10*3/uL (ref 150–400)
RBC: 4.66 MIL/uL (ref 4.22–5.81)
RDW: 14.7 % (ref 11.5–15.5)
WBC: 5.1 10*3/uL (ref 4.0–10.5)

## 2013-06-02 LAB — COMPLETE METABOLIC PANEL WITH GFR
ALBUMIN: 4 g/dL (ref 3.5–5.2)
ALK PHOS: 167 U/L — AB (ref 39–117)
ALT: 86 U/L — ABNORMAL HIGH (ref 0–53)
AST: 108 U/L — ABNORMAL HIGH (ref 0–37)
BUN: 8 mg/dL (ref 6–23)
CHLORIDE: 98 meq/L (ref 96–112)
CO2: 25 mEq/L (ref 19–32)
Calcium: 8.9 mg/dL (ref 8.4–10.5)
Creat: 0.85 mg/dL (ref 0.50–1.35)
GFR, Est Non African American: 87 mL/min
Glucose, Bld: 94 mg/dL (ref 70–99)
POTASSIUM: 4.2 meq/L (ref 3.5–5.3)
SODIUM: 135 meq/L (ref 135–145)
TOTAL PROTEIN: 7.5 g/dL (ref 6.0–8.3)
Total Bilirubin: 0.8 mg/dL (ref 0.2–1.2)

## 2013-06-02 LAB — LIPID PANEL
Cholesterol: 274 mg/dL — ABNORMAL HIGH (ref 0–200)
HDL: 60 mg/dL (ref >39)
LDL CALC: 185 mg/dL — AB (ref 0–99)
TRIGLYCERIDES: 147 mg/dL (ref <150)
Total CHOL/HDL Ratio: 4.6 Ratio
VLDL: 29 mg/dL (ref 0–40)

## 2013-06-14 ENCOUNTER — Encounter: Payer: Self-pay | Admitting: Family Medicine

## 2013-06-27 ENCOUNTER — Encounter: Payer: Self-pay | Admitting: Family Medicine

## 2013-06-28 ENCOUNTER — Telehealth: Payer: Self-pay | Admitting: Family Medicine

## 2013-06-28 NOTE — Telephone Encounter (Signed)
Refill denied for Atorvastatin.  Pt has elevated LFT's and we have not been able to reach him since his lab work has been done

## 2013-07-01 IMAGING — US US ABDOMEN LIMITED
1 series · 14 of 25 positions shown · non-contrast
Comparison: None.

CLINICAL DATA: Elevated liver function studies.  Hepatomegaly.

LIMITED ABDOMINAL ULTRASOUND - RIGHT UPPER QUADRANT

[Series 1: us abdomen limited · 0.24mm/px · 14 of 45 slices shown]
[im 1/45]
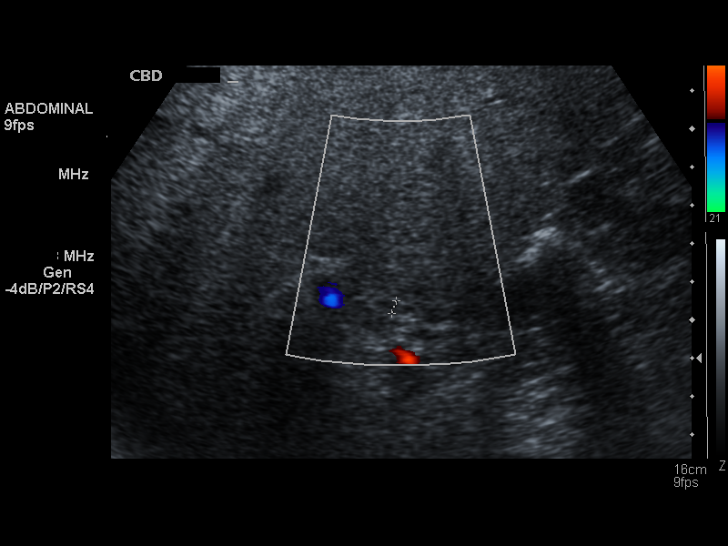
[im 4/45]
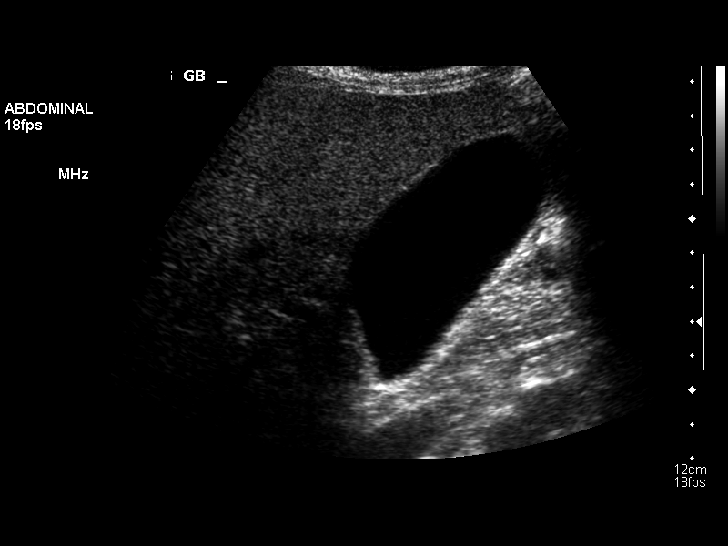
[im 8/45]
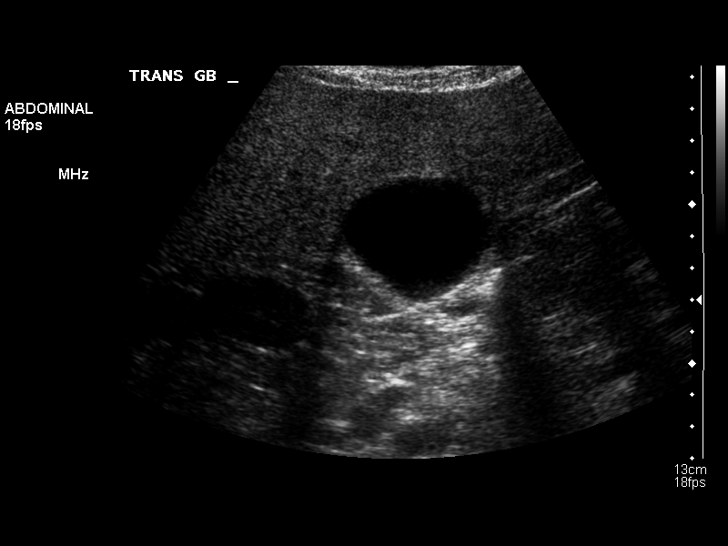
[im 12/45]
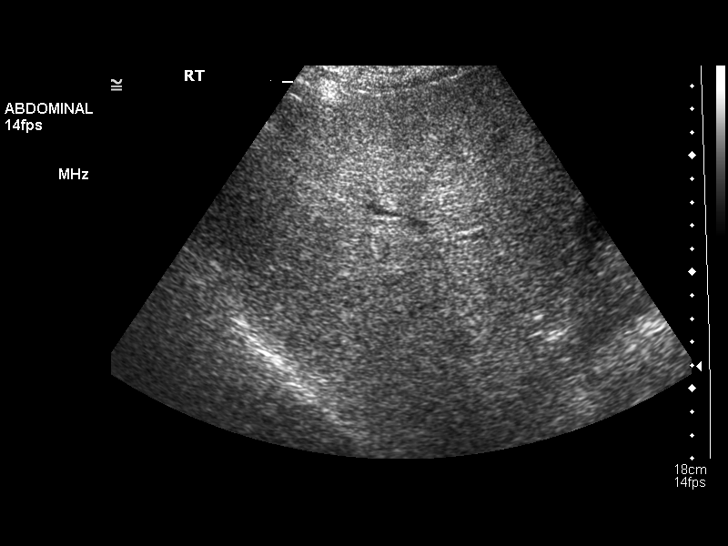
[im 15/45]
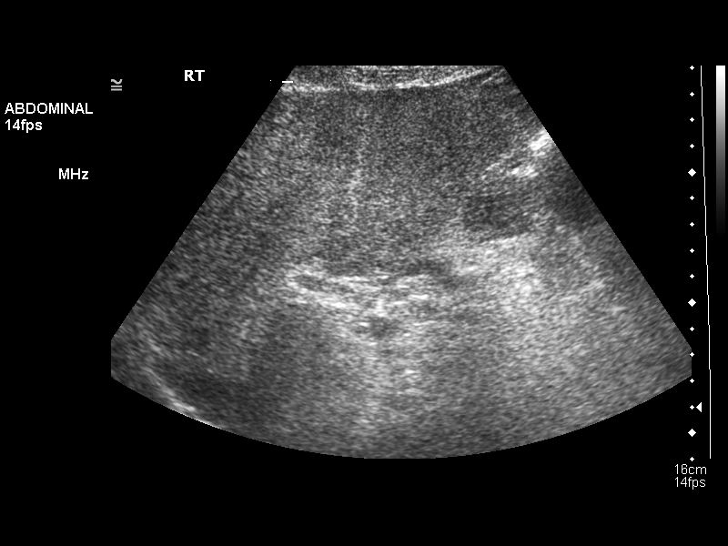
[im 17/45]
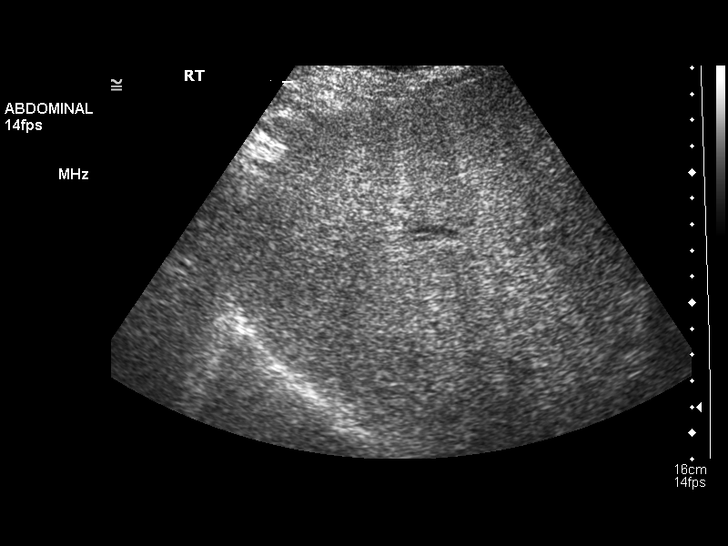
[im 21/45]
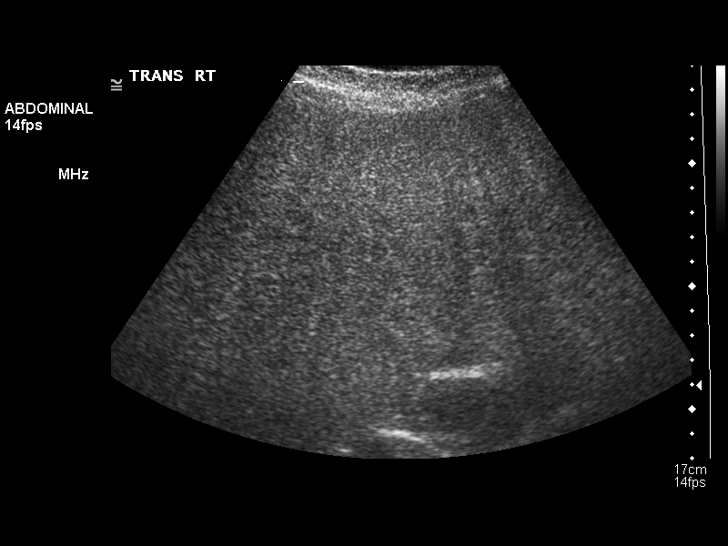
[im 24/45]
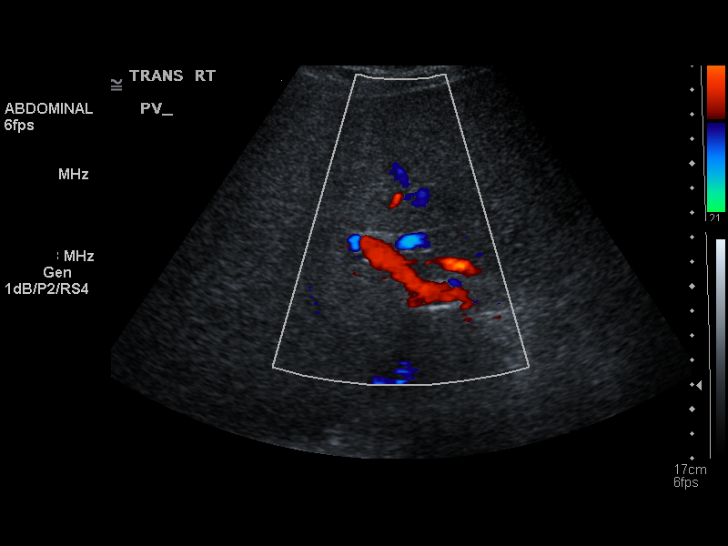
[im 28/45]
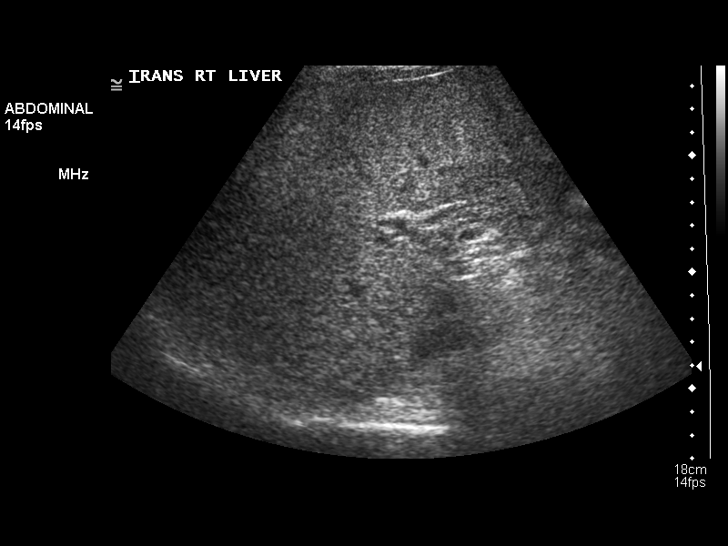
[im 30/45]
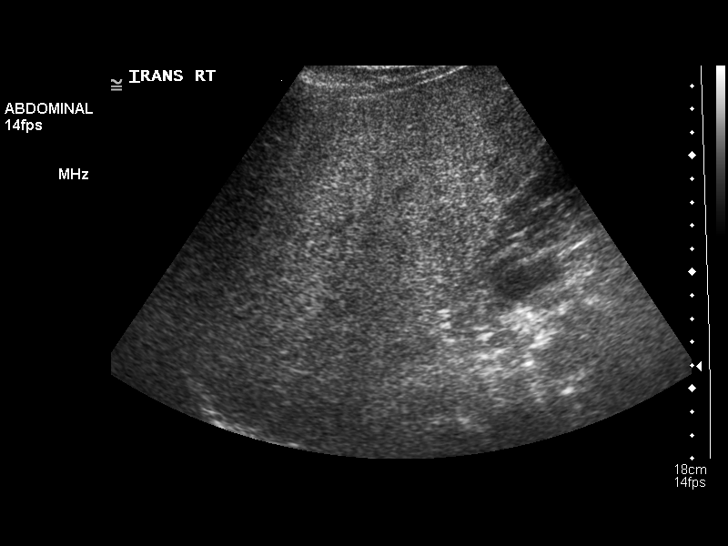
[im 34/45]
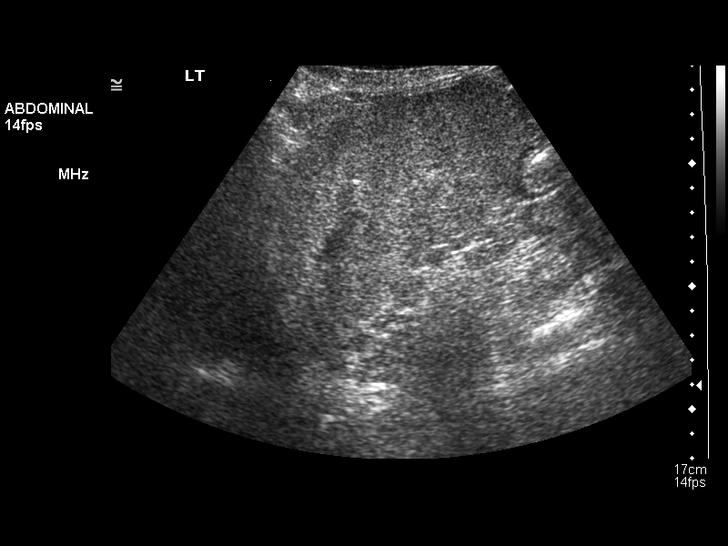
[im 37/45]
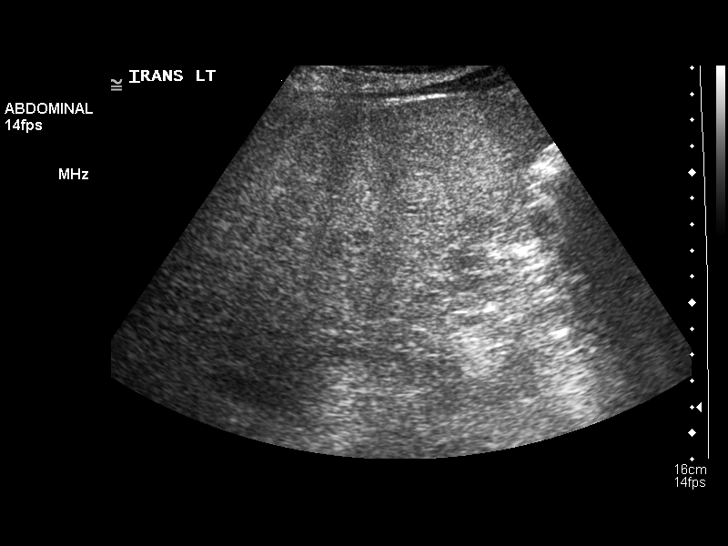
[im 41/45]
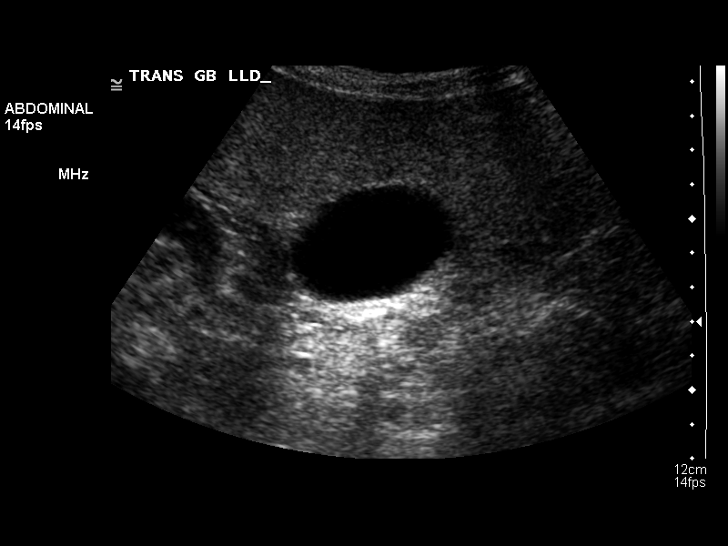
[im 45/45]
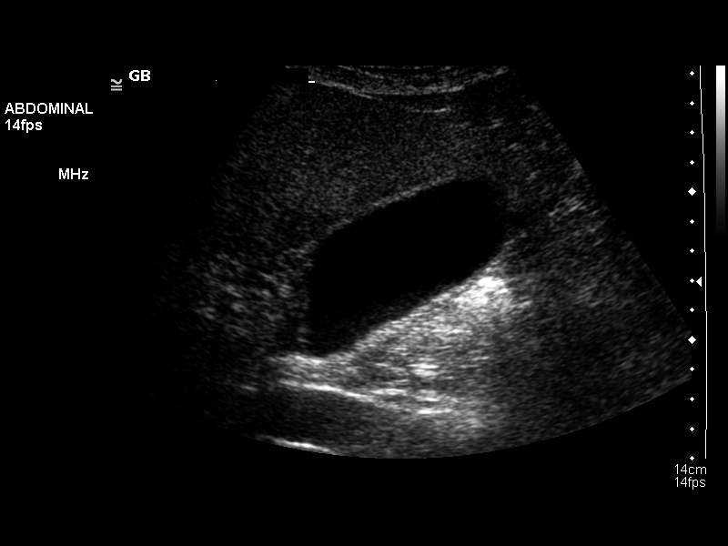

[14 of 25 positions shown; findings below may reference images not displayed]

FINDINGS: Gallbladder:  No gallstones, gallbladder wall thickening, or
pericholecystic fluid.  Negative sonographic Murphy's sign.

Common bile duct:  Within normal limits in caliber.

Liver:  The hepatic echogenicity is heterogeneously increased.  No
focal lesion or perihepatic ascites is identified.
IMPRESSION: Heterogeneous hepatic parenchyma without focal abnormality.  This
is nonspecific, but most commonly secondary to steatosis.  No
biliary disease identified.

## 2013-07-05 ENCOUNTER — Other Ambulatory Visit: Payer: Self-pay | Admitting: Family Medicine

## 2013-07-05 MED ORDER — ATORVASTATIN CALCIUM 40 MG PO TABS: 40.0000 mg | ORAL_TABLET | Freq: Every day | ORAL | Status: AC

## 2013-07-05 NOTE — Telephone Encounter (Signed)
Rx Refilled  

## 2013-10-07 ENCOUNTER — Other Ambulatory Visit: Payer: Self-pay | Admitting: Family Medicine

## 2013-10-18 ENCOUNTER — Other Ambulatory Visit: Payer: Self-pay | Admitting: Family Medicine

## 2013-11-15 ENCOUNTER — Other Ambulatory Visit: Payer: Self-pay | Admitting: Family Medicine

## 2013-11-21 ENCOUNTER — Other Ambulatory Visit: Payer: Self-pay | Admitting: Family Medicine

## 2014-04-16 ENCOUNTER — Other Ambulatory Visit: Payer: Self-pay | Admitting: Family Medicine

## 2014-05-25 ENCOUNTER — Other Ambulatory Visit: Payer: Self-pay | Admitting: Family Medicine

## 2014-05-25 NOTE — Telephone Encounter (Signed)
Refill denied.   Requires office visit before any further refills can be given.   Letter sent.  

## 2014-06-28 ENCOUNTER — Other Ambulatory Visit: Payer: Medicare Other

## 2014-07-01 ENCOUNTER — Other Ambulatory Visit: Payer: Self-pay | Admitting: Family Medicine

## 2014-07-02 ENCOUNTER — Encounter: Payer: Medicare Other | Admitting: Family Medicine

## 2014-07-03 ENCOUNTER — Encounter: Payer: Self-pay | Admitting: Family Medicine

## 2014-07-03 NOTE — Telephone Encounter (Signed)
rx denied.  Pt needs to be seen. Has been over one year since last visit.  Letter sent.
